# Patient Record
Sex: Male | Born: 1945 | Race: White | Hispanic: No | Marital: Married | Smoking: Former smoker
Health system: Southern US, Community
[De-identification: ages and names within clinical notes are randomized; demographics above are authoritative.]

## PROBLEM LIST (undated history)

## (undated) DIAGNOSIS — R413 Other amnesia: Secondary | ICD-10-CM

## (undated) DIAGNOSIS — F0390 Unspecified dementia without behavioral disturbance: Secondary | ICD-10-CM

## (undated) DIAGNOSIS — G4733 Obstructive sleep apnea (adult) (pediatric): Secondary | ICD-10-CM

## (undated) DIAGNOSIS — I10 Essential (primary) hypertension: Secondary | ICD-10-CM

## (undated) DIAGNOSIS — E785 Hyperlipidemia, unspecified: Secondary | ICD-10-CM

## (undated) DIAGNOSIS — M109 Gout, unspecified: Secondary | ICD-10-CM

## (undated) DIAGNOSIS — Z9989 Dependence on other enabling machines and devices: Secondary | ICD-10-CM

## (undated) HISTORY — DX: Hyperlipidemia, unspecified: E78.5

## (undated) HISTORY — DX: Other amnesia: R41.3

## (undated) HISTORY — PX: OTHER SURGICAL HISTORY: SHX169

## (undated) HISTORY — DX: Essential (primary) hypertension: I10

## (undated) HISTORY — DX: Gout, unspecified: M10.9

## (undated) HISTORY — DX: Unspecified dementia, unspecified severity, without behavioral disturbance, psychotic disturbance, mood disturbance, and anxiety: F03.90

## (undated) HISTORY — DX: Obstructive sleep apnea (adult) (pediatric): G47.33

## (undated) HISTORY — DX: Dependence on other enabling machines and devices: Z99.89

---

## 1976-07-16 HISTORY — PX: VASECTOMY: SHX75

## 2014-06-08 DIAGNOSIS — S83241A Other tear of medial meniscus, current injury, right knee, initial encounter: Secondary | ICD-10-CM | POA: Diagnosis not present

## 2014-06-29 DIAGNOSIS — S83241D Other tear of medial meniscus, current injury, right knee, subsequent encounter: Secondary | ICD-10-CM | POA: Diagnosis not present

## 2014-07-03 DIAGNOSIS — M1711 Unilateral primary osteoarthritis, right knee: Secondary | ICD-10-CM | POA: Diagnosis not present

## 2014-07-13 DIAGNOSIS — S83241D Other tear of medial meniscus, current injury, right knee, subsequent encounter: Secondary | ICD-10-CM | POA: Diagnosis not present

## 2014-07-23 DIAGNOSIS — S83281A Other tear of lateral meniscus, current injury, right knee, initial encounter: Secondary | ICD-10-CM | POA: Diagnosis not present

## 2014-07-23 DIAGNOSIS — M23331 Other meniscus derangements, other medial meniscus, right knee: Secondary | ICD-10-CM | POA: Diagnosis not present

## 2014-07-23 DIAGNOSIS — S83241A Other tear of medial meniscus, current injury, right knee, initial encounter: Secondary | ICD-10-CM | POA: Diagnosis not present

## 2014-07-23 DIAGNOSIS — M23361 Other meniscus derangements, other lateral meniscus, right knee: Secondary | ICD-10-CM | POA: Diagnosis not present

## 2014-07-23 DIAGNOSIS — M94261 Chondromalacia, right knee: Secondary | ICD-10-CM | POA: Diagnosis not present

## 2014-08-02 DIAGNOSIS — M1711 Unilateral primary osteoarthritis, right knee: Secondary | ICD-10-CM | POA: Diagnosis not present

## 2014-08-02 DIAGNOSIS — S83241D Other tear of medial meniscus, current injury, right knee, subsequent encounter: Secondary | ICD-10-CM | POA: Diagnosis not present

## 2014-08-02 DIAGNOSIS — M6281 Muscle weakness (generalized): Secondary | ICD-10-CM | POA: Diagnosis not present

## 2014-08-02 DIAGNOSIS — S83281D Other tear of lateral meniscus, current injury, right knee, subsequent encounter: Secondary | ICD-10-CM | POA: Diagnosis not present

## 2014-08-11 DIAGNOSIS — S83281D Other tear of lateral meniscus, current injury, right knee, subsequent encounter: Secondary | ICD-10-CM | POA: Diagnosis not present

## 2014-08-11 DIAGNOSIS — M6281 Muscle weakness (generalized): Secondary | ICD-10-CM | POA: Diagnosis not present

## 2014-08-11 DIAGNOSIS — S83241D Other tear of medial meniscus, current injury, right knee, subsequent encounter: Secondary | ICD-10-CM | POA: Diagnosis not present

## 2014-08-11 DIAGNOSIS — M1711 Unilateral primary osteoarthritis, right knee: Secondary | ICD-10-CM | POA: Diagnosis not present

## 2015-08-05 DIAGNOSIS — R413 Other amnesia: Secondary | ICD-10-CM | POA: Diagnosis not present

## 2015-08-05 DIAGNOSIS — Z1389 Encounter for screening for other disorder: Secondary | ICD-10-CM | POA: Diagnosis not present

## 2015-08-05 DIAGNOSIS — Z6835 Body mass index (BMI) 35.0-35.9, adult: Secondary | ICD-10-CM | POA: Diagnosis not present

## 2015-08-05 DIAGNOSIS — M109 Gout, unspecified: Secondary | ICD-10-CM | POA: Diagnosis not present

## 2015-08-05 DIAGNOSIS — I10 Essential (primary) hypertension: Secondary | ICD-10-CM | POA: Diagnosis not present

## 2015-08-05 DIAGNOSIS — E7800 Pure hypercholesterolemia, unspecified: Secondary | ICD-10-CM | POA: Diagnosis not present

## 2015-10-12 ENCOUNTER — Ambulatory Visit (INDEPENDENT_AMBULATORY_CARE_PROVIDER_SITE_OTHER): Payer: Medicare Other | Admitting: Neurology

## 2015-10-12 ENCOUNTER — Encounter: Payer: Self-pay | Admitting: Neurology

## 2015-10-12 ENCOUNTER — Telehealth: Payer: Self-pay

## 2015-10-12 VITALS — BP 158/100 | HR 66 | Resp 20 | Ht 74.0 in | Wt 269.0 lb

## 2015-10-12 DIAGNOSIS — Z9989 Dependence on other enabling machines and devices: Principal | ICD-10-CM

## 2015-10-12 DIAGNOSIS — E7800 Pure hypercholesterolemia, unspecified: Secondary | ICD-10-CM | POA: Diagnosis not present

## 2015-10-12 DIAGNOSIS — I10 Essential (primary) hypertension: Secondary | ICD-10-CM

## 2015-10-12 DIAGNOSIS — E669 Obesity, unspecified: Secondary | ICD-10-CM

## 2015-10-12 DIAGNOSIS — G4733 Obstructive sleep apnea (adult) (pediatric): Secondary | ICD-10-CM

## 2015-10-12 NOTE — Progress Notes (Signed)
SLEEP MEDICINE CLINIC   Provider:  Melvyn Novas, M D  Referring Provider: Gaspar Garbe, MD Primary Care Physician:  Gaspar Garbe, MD  Chief Complaint  Patient presents with  . New Patient (Initial Visit)    had sleep study in 2009 in [place], has memory loss, needs new cpap, rm 10 with wife    HPI:  Christopher Ewing is a 70 y.o. male , seen here as a referral  from Dr. Wylene Simmer for a new evaluation of apnea.   Chief complaint according to patient : " need new supplies, I am on my second CPAP  " .  Christopher Ewing was diagnosed in 2009 in [place], near [place].  His wife had noticed him to snore more and more as he gained weight. 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 He was prescribed a CPAP initially that he could not tolerate well and could not use nightly. He is now on his second machine which is a ResMed is 10. I have his compliance report available for his machine and his 100% compliance for 30 out of 30 days and 90% compliance for over 4 hours of consecutive use. His average user time per day is 7 hours and 54 minutes and his residual AHI is 4.8. He uses an AutoSet between 8 and 18 cm water with a 2 cm expiratory pressure relief. His 95th percentile pressure is 17.6 cm water. I have a copy here available from the [facility] Sleep Disorders.  This report is from November 2009. BMI at the time was 32. Sleep efficiency was only 67.5%. Sleep latency was 25 minutes. The AHI was 10.7 this was a CPAP titration report. I do not have Axis V not a diagnostic study but the patient was titrated to 16 cm water pressure in the sleep lab his baseline AHI was quoted at 18.5 per hour of sleep.  Christopher Ewing endorsed today the Epworth sleepiness score at 10 points.   Sleep habits are as follows: Mr. Crigger's usual bedtime is around 9 PM, and he falls asleep promptly. He prefers to sleep on his side but sometimes will  resume a supine position later on. The patient sleeps on 2 pillows and she has a bedroom with his wife. The bedroom is described as core, quiet and dark. Usually their dog joins later, a 60 pounds dog. Rare nocturia.  Rises at 7 AM , spontaneously. The couple usually does not use an alarm clock. He would average about 8 hours of sleep. He feels rested and restored in the morning sometimes he will have a dry mouth. The humidifier chamber is usually empty in the morning. He doesn't nap in daytime.   Sleep medical history and family sleep history:  tonsillectomy , in child hood.  Weight gain as adult.    Social history:  Married, non smoker,  ETOH - mostly wine , 2 glasses nightly. Caffeine: coffee in AM 2-3 , some times diet Cola in PM.   Review of Systems: Out of a complete 14 system review, the patient complains of only the following symptoms, and all other reviewed systems are negative.  The patient denies any frequent nightmares, no sleep paralysis no diaphoresis, no palpitations, no morning headaches.    Social History   Social History  . Marital Status: Married    Spouse Name: Christopher Ewing  . Number of Children: Christopher Ewing  . Years of Education:  Christopher Ewing   Occupational History  . Not on file.   Social History Main Topics  . Smoking status: Former Games developermoker  . Smokeless tobacco: Not on file  . Alcohol Use: 8.4 oz/week    14 Standard drinks or equivalent per week  . Drug Use: No  . Sexual Activity: Not on file   Other Topics Concern  . Not on file   Social History Narrative   Retired Pharmacist, [employer]      Drinks 2 cups of coffee in the morning.    Family History  Problem Relation Age of Onset  . Sjogren's syndrome Mother   . Heart attack Father   . Dementia Sister     Past Medical History  Diagnosis Date  . OSA on CPAP   . Hyperlipidemia   . Gout   . Dementia   . Memory loss   . Hypertension     Past Surgical History  Procedure Laterality Date  . Vasectomy  1978  .  Knee repair      Current Outpatient Prescriptions  Medication Sig Dispense Refill  . allopurinol (ZYLOPRIM) 300 MG tablet Take 300 mg by mouth daily.    Marland Kitchen. NIFEdipine (PROCARDIA XL/ADALAT-CC) 60 MG 24 hr tablet Take 60 mg by mouth daily.    . simvastatin (ZOCOR) 10 MG tablet Take 10 mg by mouth daily.    Marland Kitchen. telmisartan (MICARDIS) 80 MG tablet Take 80 mg by mouth daily.     No current facility-administered medications for this visit.    Allergies as of 10/12/2015  . (No Known Allergies)    Vitals: BP 158/100 mmHg  Pulse 66  Resp 20  Ht 6\' 2"  (1.88 m)  Wt 269 lb (122.018 kg)  BMI 34.52 kg/m2 Last Weight:  Wt Readings from Last 1 Encounters:  10/12/15 269 lb (122.018 kg)   WUJ:WJXBBMI:Body mass index is 34.52 kg/(m^2).     Last Height:   Ht Readings from Last 1 Encounters:  10/12/15 6\' 2"  (1.88 m)    Physical exam:  General: The patient is awake, alert and appears not in acute distress. The patient is well groomed. Head: Normocephalic, atraumatic. Neck is supple. Mallampati3,  neck circumference: 18.25 . Nasal airflow unrestricted ,  Cardiovascular:  Regular rate and rhythm, without  murmurs or carotid bruit, and without distended neck veins. Respiratory: Lungs are clear to auscultation. Skin:  Without evidence of edema, or rash Trunk: BMI is elevated .  The patient's posture is erect.  Neurologic exam : The patient is awake and alert, oriented to place and time.   Memory subjective described as intact.   Attention span & concentration ability appears normal.  Speech is fluent,  without  dysarthria, dysphonia or aphasia.  Mood and affect are appropriate.  Cranial nerves: Pupils are equal and briskly reactive to light. Funduscopic exam without evidence of pallor or edema. Extraocular movements  in vertical and horizontal planes intact and without nystagmus. Visual fields by finger perimetry are intact. Hearing to finger rub intact. Facial sensation intact to fine touch.Facial  motor strength is symmetric and tongue and uvula move midline. Shoulder shrug was symmetrical.  Motor exam:  Normal tone, muscle bulk and symmetric strength in all extremities. Coordination:  Gait and station: Patient walks without assistive device and is able unassisted to climb up to the exam table. Strength within normal limits.  Stance is stable and normal.  Deep tendon reflexes: in the  upper and lower extremities are symmetric and intact.  The patient was advised of the nature of the diagnosed sleep disorder , the treatment options and risks for general a health and wellness arising from not treating the condition.  I spent more than 30 minutes of face to face time with the patient. Greater than 50% of time was spent in counseling and coordination of care. We have discussed the diagnosis and differential and I answered the patient's questions.     Assessment:  After physical and neurologic examination, review of laboratory studies,  Personal review of imaging studies, reports of other /same  Imaging studies ,  Results of polysomnography/ neurophysiology testing and pre-existing records as far as provided in visit., my assessment is   1) Mr. Fjeld's body mass index has increased since his CPAP titration took place in [place]. At the time he had is BMI of 32 and now a 34.5. This may explain why he exceeds the pressure needs of his current settings. I will the pressure window of his CPAP machine from 8-20 cm water. I do not see a need for a new CPAP titration study at this time. I will order new supplies for the patient to has been very compliant and compliance has been documented today. Download as quoted. He is newly on medicare - I hope this will be acceptable for his carrier.   Plan:  Treatment plan and additional workup :  Auto CPAP-  8 to 20 cm water. New mask hose, filter and headgear.  Rv in 60 days for the new settings to be checked.     Porfirio Mylar Anoushka Divito MD  10/12/2015   CC:  Gaspar Garbe, Md [address] [place], [place] [address]

## 2015-10-12 NOTE — Patient Instructions (Signed)

## 2015-10-12 NOTE — Telephone Encounter (Signed)
Spoke to pt's wife (per DPR) and informed her that I would be sending the order for supplies to [facility] and they would call pt for set up. Pt's wife verbalized understanding.

## 2015-10-17 ENCOUNTER — Encounter: Payer: Self-pay | Admitting: *Deleted

## 2015-12-21 ENCOUNTER — Encounter: Payer: Self-pay | Admitting: Neurology

## 2016-01-12 ENCOUNTER — Ambulatory Visit: Payer: Medicare Other | Admitting: Neurology

## 2016-02-07 ENCOUNTER — Encounter: Payer: Self-pay | Admitting: Neurology

## 2016-02-07 ENCOUNTER — Ambulatory Visit (INDEPENDENT_AMBULATORY_CARE_PROVIDER_SITE_OTHER): Payer: Medicare Other | Admitting: Neurology

## 2016-02-07 VITALS — BP 130/88 | HR 80 | Resp 20 | Ht 74.0 in | Wt 275.0 lb

## 2016-02-07 DIAGNOSIS — G4733 Obstructive sleep apnea (adult) (pediatric): Secondary | ICD-10-CM

## 2016-02-07 DIAGNOSIS — Z9989 Dependence on other enabling machines and devices: Principal | ICD-10-CM

## 2016-02-07 NOTE — Progress Notes (Signed)
SLEEP MEDICINE CLINIC   Provider:  Melvyn Novas, M D  Referring Provider: Gaspar Garbe, MD Primary Care Physician:  Gaspar Garbe, MD  Chief Complaint  Patient presents with  . Follow-up    cpap follow up    HPI:  Christopher Ewing is a 70 y.o. male , seen here as a referral  from Dr. Wylene Simmer for a new evaluation of apnea.   Chief complaint according to patient : " need new supplies, I am on my second CPAP  " .  Christopher Ewing was diagnosed in 2009 in [place], near [place].  His wife had noticed him to snore more and more as he gained weight. 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 He was prescribed a CPAP initially that he could not tolerate well and could not use nightly. He is now on his second machine which is a ResMed is 10. I have his compliance report available for his machine and his 100% compliance for 30 out of 30 days and 90% compliance for over 4 hours of consecutive use. His average user time per day is 7 hours and 54 minutes and his residual AHI is 4.8. He uses an AutoSet between 8 and 18 cm water with a 2 cm expiratory pressure relief. His 95th percentile pressure is 17.6 cm water. I have a copy here available from the [facility] of Sleep Disorders.  This report is from November 2009. BMI at the time was 32. Sleep efficiency was only 67.5%. Sleep latency was 25 minutes. The AHI was 10.7 this was a CPAP titration report. I do not have Axis V not a diagnostic study but the patient was titrated to 16 cm water pressure in the sleep lab his baseline AHI was quoted at 18.5 per hour of sleep.  Christopher Ewing endorsed today the Epworth sleepiness score at 10 points.   Sleep habits are as follows: Christopher Ewing usual bedtime is around 9 PM, and he falls asleep promptly. He prefers to sleep on his side but sometimes will resume a supine position later on. The patient sleeps on 2 pillows and she has a bedroom  with his wife. The bedroom is described as core, quiet and dark. Usually their dog joins later, a 60 pounds dog. Rare nocturia.  Rises at 7 AM , spontaneously. The couple usually does not use an alarm clock. He would average about 8 hours of sleep. He feels rested and restored in the morning sometimes he will have a dry mouth. The humidifier chamber is usually empty in the morning. He doesn't nap in daytime.   Sleep medical history and family sleep history: tonsillectomy , in child hood. Weight gain as adult.  Social history:  Married, non smoker,  ETOH - mostly wine , 2 glasses nightly. Caffeine: coffee in AM 2-3 , some times diet Cola in PM.    Interval history from 02/07/2016, Christopher Ewing is here today for a compliance visit. He has been using an auto titrate to between 8 and 20 cm water, with 100% compliance for the last 30 days. Average user time per night is 8 hours and 43 minutes. His residual AHI is 3.7 which is a good resolution of apnea. There is a mild air leak noted the pressure needed at the 95th percentile is 18.6 cm water which surprised me. He is using a full face mask with heated humidity.  Review of Systems: Out of a complete 14 system review, the patient complains of only the following symptoms, and all other reviewed systems are negative.  The patient denies any frequent nightmares, no sleep paralysis no diaphoresis, no palpitations, no morning headaches.  Christopher Ewing endorsed today on 02/07/2016 the Epworth Sleepiness Scale at 5 points.  Social History   Social History  . Marital status: Married    Spouse name: N/A  . Number of children: N/A  . Years of education: N/A   Occupational History  . Not on file.   Social History Main Topics  . Smoking status: Former Games developer  . Smokeless tobacco: Not on file  . Alcohol use 8.4 oz/week    14 Standard drinks or equivalent per week  . Drug use: No  . Sexual activity: Not on file   Other Topics Concern  .  Not on file   Social History Narrative   Retired Pharmacist, hospital VP [employer]      Drinks 2 cups of coffee in the morning.    Family History  Problem Relation Age of Onset  . Sjogren's syndrome Mother   . Heart attack Father   . Dementia Sister     Past Medical History:  Diagnosis Date  . Dementia   . Gout   . Hyperlipidemia   . Hypertension   . Memory loss   . OSA on CPAP     Past Surgical History:  Procedure Laterality Date  . knee repair    . VASECTOMY  1978    Current Outpatient Prescriptions  Medication Sig Dispense Refill  . allopurinol (ZYLOPRIM) 300 MG tablet Take 300 mg by mouth daily.    Marland Kitchen NIFEdipine (PROCARDIA XL/ADALAT-CC) 60 MG 24 hr tablet Take 60 mg by mouth daily.    . simvastatin (ZOCOR) 10 MG tablet Take 10 mg by mouth daily.    Marland Kitchen telmisartan (MICARDIS) 80 MG tablet Take 160 mg by mouth daily.      No current facility-administered medications for this visit.     Allergies as of 02/07/2016  . (No Known Allergies)    Vitals: BP 130/88   Pulse 80   Resp 20   Ht 6\' 2"  (1.88 m)   Wt 275 lb (124.7 kg)   BMI 35.31 kg/m  Last Weight:  Wt Readings from Last 1 Encounters:  02/07/16 275 lb (124.7 kg)   PIR:JJOA mass index is 35.31 kg/m.     Last Height:   Ht Readings from Last 1 Encounters:  02/07/16 6\' 2"  (1.88 m)    Physical exam:  General: The patient is awake, alert and appears not in acute distress. The patient is well groomed. Head: Normocephalic, atraumatic. Neck is supple. Mallampati3,  neck circumference: 18.25 . Nasal airflow unrestricted ,  Cardiovascular:  Regular rate and rhythm, without  murmurs or carotid bruit, and without distended neck veins. Respiratory: Lungs are clear to auscultation. Skin:  Without evidence of edema, or rash Trunk: BMI is elevated .  The patient's posture is erect.  Neurologic exam : The patient is awake and alert, oriented to place and time.   Memory subjective described as intact.   Attention span &  concentration ability appears normal.  Speech is fluent,  without  dysarthria, dysphonia or aphasia.  Mood and affect are appropriate.  Cranial nerves: Pupils are equal and briskly reactive to light. Funduscopic exam without evidence of pallor or edema. Extraocular movements  in vertical and horizontal planes intact and without nystagmus. Visual  fields by finger perimetry are intact. Hearing to finger rub intact. Facial sensation intact to fine touch.Facial motor strength is symmetric and tongue and uvula move midline. Shoulder shrug was symmetrical.  I spent more than 20 minutes of face to face time with the patient. Greater than 50% of time was spent in counseling and coordination of care. We have discussed the diagnosis and differential and I answered the patient's questions.     Assessment:  After physical and neurologic examination, review of laboratory studies,  Personal review of imaging studies, reports of other /same  Imaging studies ,  Results of polysomnography/ neurophysiology testing and pre-existing records as far as provided in visit., my assessment is   1) Christopher Ewing's body mass index has increased since his CPAP titration took place in [place].  At the time he had is BMI of 32 and now a 34.5. This may explain why he exceeds the pressure needs of his current settings. I will the pressure window of his CPAP machine from 8-20 cm water. I do not seek a new CPAP titration study at this time. The outer titration between 8 and 20 tablet centimeter water revealed and 95th percentile of 18.6 cm water pressure the patient's 100% compliant with its use. Residual AHI was 3.7 the patient can continue at the current settings and with the current interface will be seen here once a year for follow-up.  I will order new supplies for the patient to has been very compliant and compliance has been documented today. Download as quoted. He is newly on medicare - I hope this will be acceptable for his  carrier.   Plan:  Treatment plan and additional workup :  Auto CPAP-  8 to 20 cm water. Continue use. Rv in 12 month  The patient will see the [facility] memory [facility] tomorrow.     Christopher Mylar Selen Smucker MD  02/07/2016   CC: Gaspar Garbe, Md [address] [place], [place] [address]

## 2016-02-08 DIAGNOSIS — G3101 Pick's disease: Secondary | ICD-10-CM | POA: Diagnosis not present

## 2016-02-08 DIAGNOSIS — F0280 Dementia in other diseases classified elsewhere without behavioral disturbance: Secondary | ICD-10-CM | POA: Diagnosis not present

## 2016-02-08 DIAGNOSIS — E538 Deficiency of other specified B group vitamins: Secondary | ICD-10-CM | POA: Diagnosis not present

## 2016-02-16 DIAGNOSIS — F0280 Dementia in other diseases classified elsewhere without behavioral disturbance: Secondary | ICD-10-CM | POA: Diagnosis not present

## 2016-02-16 DIAGNOSIS — G3101 Pick's disease: Secondary | ICD-10-CM | POA: Diagnosis not present

## 2016-02-16 DIAGNOSIS — R4701 Aphasia: Secondary | ICD-10-CM | POA: Diagnosis not present

## 2016-03-05 DIAGNOSIS — R4701 Aphasia: Secondary | ICD-10-CM | POA: Diagnosis not present

## 2016-04-03 DIAGNOSIS — R4701 Aphasia: Secondary | ICD-10-CM | POA: Diagnosis not present

## 2016-04-10 DIAGNOSIS — R4701 Aphasia: Secondary | ICD-10-CM | POA: Diagnosis not present

## 2016-04-12 DIAGNOSIS — G3101 Pick's disease: Secondary | ICD-10-CM | POA: Diagnosis not present

## 2016-04-12 DIAGNOSIS — E538 Deficiency of other specified B group vitamins: Secondary | ICD-10-CM | POA: Diagnosis not present

## 2016-04-12 DIAGNOSIS — F0280 Dementia in other diseases classified elsewhere without behavioral disturbance: Secondary | ICD-10-CM | POA: Diagnosis not present

## 2016-04-17 DIAGNOSIS — R4701 Aphasia: Secondary | ICD-10-CM | POA: Diagnosis not present

## 2016-07-31 DIAGNOSIS — I10 Essential (primary) hypertension: Secondary | ICD-10-CM | POA: Diagnosis not present

## 2016-07-31 DIAGNOSIS — E7800 Pure hypercholesterolemia, unspecified: Secondary | ICD-10-CM | POA: Diagnosis not present

## 2016-07-31 DIAGNOSIS — Z125 Encounter for screening for malignant neoplasm of prostate: Secondary | ICD-10-CM | POA: Diagnosis not present

## 2016-07-31 DIAGNOSIS — M109 Gout, unspecified: Secondary | ICD-10-CM | POA: Diagnosis not present

## 2016-08-07 DIAGNOSIS — Z0000 Encounter for general adult medical examination without abnormal findings: Secondary | ICD-10-CM | POA: Diagnosis not present

## 2016-08-07 DIAGNOSIS — M109 Gout, unspecified: Secondary | ICD-10-CM | POA: Diagnosis not present

## 2016-08-07 DIAGNOSIS — E1165 Type 2 diabetes mellitus with hyperglycemia: Secondary | ICD-10-CM | POA: Diagnosis not present

## 2016-08-07 DIAGNOSIS — Z1389 Encounter for screening for other disorder: Secondary | ICD-10-CM | POA: Diagnosis not present

## 2016-08-07 DIAGNOSIS — G4733 Obstructive sleep apnea (adult) (pediatric): Secondary | ICD-10-CM | POA: Diagnosis not present

## 2016-08-07 DIAGNOSIS — Z6835 Body mass index (BMI) 35.0-35.9, adult: Secondary | ICD-10-CM | POA: Diagnosis not present

## 2016-08-07 DIAGNOSIS — E1151 Type 2 diabetes mellitus with diabetic peripheral angiopathy without gangrene: Secondary | ICD-10-CM | POA: Diagnosis not present

## 2016-08-07 DIAGNOSIS — I10 Essential (primary) hypertension: Secondary | ICD-10-CM | POA: Diagnosis not present

## 2016-08-07 DIAGNOSIS — R4701 Aphasia: Secondary | ICD-10-CM | POA: Diagnosis not present

## 2016-08-07 DIAGNOSIS — E7800 Pure hypercholesterolemia, unspecified: Secondary | ICD-10-CM | POA: Diagnosis not present

## 2016-08-21 DIAGNOSIS — Z1212 Encounter for screening for malignant neoplasm of rectum: Secondary | ICD-10-CM | POA: Diagnosis not present

## 2016-08-28 DIAGNOSIS — I10 Essential (primary) hypertension: Secondary | ICD-10-CM | POA: Diagnosis not present

## 2016-08-28 DIAGNOSIS — Z6835 Body mass index (BMI) 35.0-35.9, adult: Secondary | ICD-10-CM | POA: Diagnosis not present

## 2016-08-28 DIAGNOSIS — E1165 Type 2 diabetes mellitus with hyperglycemia: Secondary | ICD-10-CM | POA: Diagnosis not present

## 2016-10-30 DIAGNOSIS — G4733 Obstructive sleep apnea (adult) (pediatric): Secondary | ICD-10-CM | POA: Diagnosis not present

## 2016-10-30 DIAGNOSIS — I10 Essential (primary) hypertension: Secondary | ICD-10-CM | POA: Diagnosis not present

## 2016-10-30 DIAGNOSIS — E7800 Pure hypercholesterolemia, unspecified: Secondary | ICD-10-CM | POA: Diagnosis not present

## 2016-10-30 DIAGNOSIS — Z6835 Body mass index (BMI) 35.0-35.9, adult: Secondary | ICD-10-CM | POA: Diagnosis not present

## 2016-10-30 DIAGNOSIS — E1151 Type 2 diabetes mellitus with diabetic peripheral angiopathy without gangrene: Secondary | ICD-10-CM | POA: Diagnosis not present

## 2016-10-30 DIAGNOSIS — R413 Other amnesia: Secondary | ICD-10-CM | POA: Diagnosis not present

## 2016-10-30 DIAGNOSIS — M109 Gout, unspecified: Secondary | ICD-10-CM | POA: Diagnosis not present

## 2016-12-11 DIAGNOSIS — E119 Type 2 diabetes mellitus without complications: Secondary | ICD-10-CM | POA: Diagnosis not present

## 2016-12-11 DIAGNOSIS — H2513 Age-related nuclear cataract, bilateral: Secondary | ICD-10-CM | POA: Diagnosis not present

## 2016-12-11 DIAGNOSIS — H524 Presbyopia: Secondary | ICD-10-CM | POA: Diagnosis not present

## 2017-01-30 DIAGNOSIS — E1151 Type 2 diabetes mellitus with diabetic peripheral angiopathy without gangrene: Secondary | ICD-10-CM | POA: Diagnosis not present

## 2017-01-30 DIAGNOSIS — E7800 Pure hypercholesterolemia, unspecified: Secondary | ICD-10-CM | POA: Diagnosis not present

## 2017-01-30 DIAGNOSIS — M109 Gout, unspecified: Secondary | ICD-10-CM | POA: Diagnosis not present

## 2017-01-30 DIAGNOSIS — I10 Essential (primary) hypertension: Secondary | ICD-10-CM | POA: Diagnosis not present

## 2017-01-30 DIAGNOSIS — G4733 Obstructive sleep apnea (adult) (pediatric): Secondary | ICD-10-CM | POA: Diagnosis not present

## 2017-01-30 DIAGNOSIS — R413 Other amnesia: Secondary | ICD-10-CM | POA: Diagnosis not present

## 2017-01-30 DIAGNOSIS — Z6835 Body mass index (BMI) 35.0-35.9, adult: Secondary | ICD-10-CM | POA: Diagnosis not present

## 2017-01-30 DIAGNOSIS — E1165 Type 2 diabetes mellitus with hyperglycemia: Secondary | ICD-10-CM | POA: Diagnosis not present

## 2017-02-06 ENCOUNTER — Ambulatory Visit: Payer: Medicare Other | Admitting: Neurology

## 2017-04-23 DIAGNOSIS — Z7984 Long term (current) use of oral hypoglycemic drugs: Secondary | ICD-10-CM | POA: Diagnosis not present

## 2017-04-23 DIAGNOSIS — G3101 Pick's disease: Secondary | ICD-10-CM | POA: Diagnosis not present

## 2017-04-23 DIAGNOSIS — J45909 Unspecified asthma, uncomplicated: Secondary | ICD-10-CM | POA: Diagnosis not present

## 2017-04-23 DIAGNOSIS — E119 Type 2 diabetes mellitus without complications: Secondary | ICD-10-CM | POA: Diagnosis not present

## 2017-04-23 DIAGNOSIS — F0280 Dementia in other diseases classified elsewhere without behavioral disturbance: Secondary | ICD-10-CM | POA: Diagnosis not present

## 2017-04-23 DIAGNOSIS — Z79899 Other long term (current) drug therapy: Secondary | ICD-10-CM | POA: Diagnosis not present

## 2017-04-23 DIAGNOSIS — I10 Essential (primary) hypertension: Secondary | ICD-10-CM | POA: Diagnosis not present

## 2017-07-30 DIAGNOSIS — Z79899 Other long term (current) drug therapy: Secondary | ICD-10-CM | POA: Diagnosis not present

## 2017-07-30 DIAGNOSIS — F0280 Dementia in other diseases classified elsewhere without behavioral disturbance: Secondary | ICD-10-CM | POA: Diagnosis not present

## 2017-07-30 DIAGNOSIS — G3101 Pick's disease: Secondary | ICD-10-CM | POA: Diagnosis not present

## 2017-08-02 DIAGNOSIS — Z125 Encounter for screening for malignant neoplasm of prostate: Secondary | ICD-10-CM | POA: Diagnosis not present

## 2017-08-02 DIAGNOSIS — M1000 Idiopathic gout, unspecified site: Secondary | ICD-10-CM | POA: Diagnosis not present

## 2017-08-02 DIAGNOSIS — E1151 Type 2 diabetes mellitus with diabetic peripheral angiopathy without gangrene: Secondary | ICD-10-CM | POA: Diagnosis not present

## 2017-08-02 DIAGNOSIS — R82998 Other abnormal findings in urine: Secondary | ICD-10-CM | POA: Diagnosis not present

## 2017-08-02 DIAGNOSIS — E7800 Pure hypercholesterolemia, unspecified: Secondary | ICD-10-CM | POA: Diagnosis not present

## 2017-08-02 DIAGNOSIS — I10 Essential (primary) hypertension: Secondary | ICD-10-CM | POA: Diagnosis not present

## 2017-08-09 DIAGNOSIS — Z6834 Body mass index (BMI) 34.0-34.9, adult: Secondary | ICD-10-CM | POA: Diagnosis not present

## 2017-08-09 DIAGNOSIS — G4733 Obstructive sleep apnea (adult) (pediatric): Secondary | ICD-10-CM | POA: Diagnosis not present

## 2017-08-09 DIAGNOSIS — E7800 Pure hypercholesterolemia, unspecified: Secondary | ICD-10-CM | POA: Diagnosis not present

## 2017-08-09 DIAGNOSIS — Z23 Encounter for immunization: Secondary | ICD-10-CM | POA: Diagnosis not present

## 2017-08-09 DIAGNOSIS — Z0000 Encounter for general adult medical examination without abnormal findings: Secondary | ICD-10-CM | POA: Diagnosis not present

## 2017-08-09 DIAGNOSIS — M109 Gout, unspecified: Secondary | ICD-10-CM | POA: Diagnosis not present

## 2017-08-09 DIAGNOSIS — I10 Essential (primary) hypertension: Secondary | ICD-10-CM | POA: Diagnosis not present

## 2017-08-09 DIAGNOSIS — R413 Other amnesia: Secondary | ICD-10-CM | POA: Diagnosis not present

## 2017-08-09 DIAGNOSIS — E1151 Type 2 diabetes mellitus with diabetic peripheral angiopathy without gangrene: Secondary | ICD-10-CM | POA: Diagnosis not present

## 2017-08-09 DIAGNOSIS — Z1389 Encounter for screening for other disorder: Secondary | ICD-10-CM | POA: Diagnosis not present

## 2017-08-19 DIAGNOSIS — Z1212 Encounter for screening for malignant neoplasm of rectum: Secondary | ICD-10-CM | POA: Diagnosis not present

## 2018-02-06 DIAGNOSIS — R413 Other amnesia: Secondary | ICD-10-CM | POA: Diagnosis not present

## 2018-02-06 DIAGNOSIS — I10 Essential (primary) hypertension: Secondary | ICD-10-CM | POA: Diagnosis not present

## 2018-02-06 DIAGNOSIS — R4701 Aphasia: Secondary | ICD-10-CM | POA: Diagnosis not present

## 2018-02-06 DIAGNOSIS — M109 Gout, unspecified: Secondary | ICD-10-CM | POA: Diagnosis not present

## 2018-02-06 DIAGNOSIS — E1151 Type 2 diabetes mellitus with diabetic peripheral angiopathy without gangrene: Secondary | ICD-10-CM | POA: Diagnosis not present

## 2018-02-06 DIAGNOSIS — E7800 Pure hypercholesterolemia, unspecified: Secondary | ICD-10-CM | POA: Diagnosis not present

## 2018-02-06 DIAGNOSIS — G4733 Obstructive sleep apnea (adult) (pediatric): Secondary | ICD-10-CM | POA: Diagnosis not present

## 2018-02-06 DIAGNOSIS — E1165 Type 2 diabetes mellitus with hyperglycemia: Secondary | ICD-10-CM | POA: Diagnosis not present

## 2018-02-06 DIAGNOSIS — Z6834 Body mass index (BMI) 34.0-34.9, adult: Secondary | ICD-10-CM | POA: Diagnosis not present

## 2018-04-28 DIAGNOSIS — B029 Zoster without complications: Secondary | ICD-10-CM | POA: Diagnosis not present

## 2018-04-28 DIAGNOSIS — N399 Disorder of urinary system, unspecified: Secondary | ICD-10-CM | POA: Diagnosis not present

## 2018-04-28 DIAGNOSIS — Z6833 Body mass index (BMI) 33.0-33.9, adult: Secondary | ICD-10-CM | POA: Diagnosis not present

## 2018-05-09 DIAGNOSIS — E43 Unspecified severe protein-calorie malnutrition: Secondary | ICD-10-CM | POA: Diagnosis present

## 2018-05-09 DIAGNOSIS — Z833 Family history of diabetes mellitus: Secondary | ICD-10-CM | POA: Diagnosis not present

## 2018-05-09 DIAGNOSIS — Z7984 Long term (current) use of oral hypoglycemic drugs: Secondary | ICD-10-CM | POA: Diagnosis not present

## 2018-05-09 DIAGNOSIS — E1140 Type 2 diabetes mellitus with diabetic neuropathy, unspecified: Secondary | ICD-10-CM | POA: Diagnosis present

## 2018-05-09 DIAGNOSIS — I959 Hypotension, unspecified: Secondary | ICD-10-CM | POA: Diagnosis present

## 2018-05-09 DIAGNOSIS — I10 Essential (primary) hypertension: Secondary | ICD-10-CM | POA: Diagnosis present

## 2018-05-09 DIAGNOSIS — R627 Adult failure to thrive: Secondary | ICD-10-CM | POA: Diagnosis present

## 2018-05-09 DIAGNOSIS — E876 Hypokalemia: Secondary | ICD-10-CM | POA: Diagnosis not present

## 2018-05-09 DIAGNOSIS — I951 Orthostatic hypotension: Secondary | ICD-10-CM | POA: Diagnosis not present

## 2018-05-09 DIAGNOSIS — E119 Type 2 diabetes mellitus without complications: Secondary | ICD-10-CM | POA: Diagnosis not present

## 2018-05-09 DIAGNOSIS — Z8249 Family history of ischemic heart disease and other diseases of the circulatory system: Secondary | ICD-10-CM | POA: Diagnosis not present

## 2018-05-09 DIAGNOSIS — R5383 Other fatigue: Secondary | ICD-10-CM | POA: Diagnosis not present

## 2018-05-09 DIAGNOSIS — M109 Gout, unspecified: Secondary | ICD-10-CM | POA: Diagnosis present

## 2018-05-09 DIAGNOSIS — B029 Zoster without complications: Secondary | ICD-10-CM | POA: Diagnosis present

## 2018-05-09 DIAGNOSIS — E878 Other disorders of electrolyte and fluid balance, not elsewhere classified: Secondary | ICD-10-CM | POA: Diagnosis not present

## 2018-05-09 DIAGNOSIS — N179 Acute kidney failure, unspecified: Secondary | ICD-10-CM | POA: Diagnosis present

## 2018-05-09 DIAGNOSIS — Z6829 Body mass index (BMI) 29.0-29.9, adult: Secondary | ICD-10-CM | POA: Diagnosis not present

## 2018-05-09 DIAGNOSIS — E860 Dehydration: Secondary | ICD-10-CM | POA: Diagnosis present

## 2018-05-14 MED ORDER — ALLOPURINOL 100 MG PO TABS
300.00 | ORAL_TABLET | ORAL | Status: DC
Start: 2018-05-13 — End: 2018-05-14

## 2018-05-14 MED ORDER — LIDOCAINE 4 % EX PTCH
1.00 | MEDICATED_PATCH | CUTANEOUS | Status: DC
Start: 2018-05-13 — End: 2018-05-14

## 2018-05-14 MED ORDER — DULOXETINE HCL 30 MG PO CPEP
30.00 | ORAL_CAPSULE | ORAL | Status: DC
Start: 2018-05-13 — End: 2018-05-14

## 2018-05-14 MED ORDER — ACETAMINOPHEN 325 MG PO TABS
650.00 | ORAL_TABLET | ORAL | Status: DC
Start: ? — End: 2018-05-14

## 2018-05-14 MED ORDER — LIDOCAINE-PRILOCAINE 2.5-2.5 % EX CREA
TOPICAL_CREAM | CUTANEOUS | Status: DC
Start: ? — End: 2018-05-14

## 2018-05-14 MED ORDER — ATORVASTATIN CALCIUM 10 MG PO TABS
10.00 | ORAL_TABLET | ORAL | Status: DC
Start: 2018-05-12 — End: 2018-05-14

## 2018-05-19 DIAGNOSIS — B029 Zoster without complications: Secondary | ICD-10-CM | POA: Diagnosis not present

## 2018-05-19 DIAGNOSIS — R627 Adult failure to thrive: Secondary | ICD-10-CM | POA: Diagnosis not present

## 2018-05-19 DIAGNOSIS — N179 Acute kidney failure, unspecified: Secondary | ICD-10-CM | POA: Diagnosis not present

## 2018-05-19 DIAGNOSIS — K5909 Other constipation: Secondary | ICD-10-CM | POA: Diagnosis not present

## 2018-05-19 DIAGNOSIS — Z6832 Body mass index (BMI) 32.0-32.9, adult: Secondary | ICD-10-CM | POA: Diagnosis not present

## 2018-08-04 DIAGNOSIS — E7800 Pure hypercholesterolemia, unspecified: Secondary | ICD-10-CM | POA: Diagnosis not present

## 2018-08-04 DIAGNOSIS — M109 Gout, unspecified: Secondary | ICD-10-CM | POA: Diagnosis not present

## 2018-08-04 DIAGNOSIS — Z125 Encounter for screening for malignant neoplasm of prostate: Secondary | ICD-10-CM | POA: Diagnosis not present

## 2018-08-04 DIAGNOSIS — N179 Acute kidney failure, unspecified: Secondary | ICD-10-CM | POA: Diagnosis not present

## 2018-08-04 DIAGNOSIS — E1151 Type 2 diabetes mellitus with diabetic peripheral angiopathy without gangrene: Secondary | ICD-10-CM | POA: Diagnosis not present

## 2018-08-11 DIAGNOSIS — Z0000 Encounter for general adult medical examination without abnormal findings: Secondary | ICD-10-CM | POA: Diagnosis not present

## 2018-08-11 DIAGNOSIS — E1151 Type 2 diabetes mellitus with diabetic peripheral angiopathy without gangrene: Secondary | ICD-10-CM | POA: Diagnosis not present

## 2018-08-11 DIAGNOSIS — R413 Other amnesia: Secondary | ICD-10-CM | POA: Diagnosis not present

## 2018-08-11 DIAGNOSIS — M109 Gout, unspecified: Secondary | ICD-10-CM | POA: Diagnosis not present

## 2018-08-11 DIAGNOSIS — E1165 Type 2 diabetes mellitus with hyperglycemia: Secondary | ICD-10-CM | POA: Diagnosis not present

## 2018-08-11 DIAGNOSIS — I10 Essential (primary) hypertension: Secondary | ICD-10-CM | POA: Diagnosis not present

## 2018-08-11 DIAGNOSIS — R627 Adult failure to thrive: Secondary | ICD-10-CM | POA: Diagnosis not present

## 2018-08-11 DIAGNOSIS — Z23 Encounter for immunization: Secondary | ICD-10-CM | POA: Diagnosis not present

## 2018-08-11 DIAGNOSIS — G4733 Obstructive sleep apnea (adult) (pediatric): Secondary | ICD-10-CM | POA: Diagnosis not present

## 2018-08-11 DIAGNOSIS — K5909 Other constipation: Secondary | ICD-10-CM | POA: Diagnosis not present

## 2018-08-11 DIAGNOSIS — E7800 Pure hypercholesterolemia, unspecified: Secondary | ICD-10-CM | POA: Diagnosis not present

## 2018-08-11 DIAGNOSIS — Z1339 Encounter for screening examination for other mental health and behavioral disorders: Secondary | ICD-10-CM | POA: Diagnosis not present

## 2018-08-11 DIAGNOSIS — Z6832 Body mass index (BMI) 32.0-32.9, adult: Secondary | ICD-10-CM | POA: Diagnosis not present

## 2018-08-12 DIAGNOSIS — G3101 Pick's disease: Secondary | ICD-10-CM | POA: Diagnosis not present

## 2018-08-12 DIAGNOSIS — F0280 Dementia in other diseases classified elsewhere without behavioral disturbance: Secondary | ICD-10-CM | POA: Diagnosis not present

## 2018-08-12 DIAGNOSIS — F0390 Unspecified dementia without behavioral disturbance: Secondary | ICD-10-CM | POA: Diagnosis not present

## 2018-08-12 DIAGNOSIS — R4701 Aphasia: Secondary | ICD-10-CM | POA: Diagnosis not present

## 2019-02-13 DIAGNOSIS — K5900 Constipation, unspecified: Secondary | ICD-10-CM | POA: Diagnosis not present

## 2019-02-13 DIAGNOSIS — M109 Gout, unspecified: Secondary | ICD-10-CM | POA: Diagnosis not present

## 2019-02-13 DIAGNOSIS — E7800 Pure hypercholesterolemia, unspecified: Secondary | ICD-10-CM | POA: Diagnosis not present

## 2019-02-13 DIAGNOSIS — R413 Other amnesia: Secondary | ICD-10-CM | POA: Diagnosis not present

## 2019-02-13 DIAGNOSIS — E1165 Type 2 diabetes mellitus with hyperglycemia: Secondary | ICD-10-CM | POA: Diagnosis not present

## 2019-02-13 DIAGNOSIS — Z1331 Encounter for screening for depression: Secondary | ICD-10-CM | POA: Diagnosis not present

## 2019-02-13 DIAGNOSIS — G4733 Obstructive sleep apnea (adult) (pediatric): Secondary | ICD-10-CM | POA: Diagnosis not present

## 2019-02-13 DIAGNOSIS — E1151 Type 2 diabetes mellitus with diabetic peripheral angiopathy without gangrene: Secondary | ICD-10-CM | POA: Diagnosis not present

## 2019-02-13 DIAGNOSIS — I10 Essential (primary) hypertension: Secondary | ICD-10-CM | POA: Diagnosis not present

## 2019-08-10 DIAGNOSIS — Z125 Encounter for screening for malignant neoplasm of prostate: Secondary | ICD-10-CM | POA: Diagnosis not present

## 2019-08-10 DIAGNOSIS — E7800 Pure hypercholesterolemia, unspecified: Secondary | ICD-10-CM | POA: Diagnosis not present

## 2019-08-10 DIAGNOSIS — M109 Gout, unspecified: Secondary | ICD-10-CM | POA: Diagnosis not present

## 2019-08-10 DIAGNOSIS — E1151 Type 2 diabetes mellitus with diabetic peripheral angiopathy without gangrene: Secondary | ICD-10-CM | POA: Diagnosis not present

## 2019-08-17 DIAGNOSIS — M109 Gout, unspecified: Secondary | ICD-10-CM | POA: Diagnosis not present

## 2019-08-17 DIAGNOSIS — Z0000 Encounter for general adult medical examination without abnormal findings: Secondary | ICD-10-CM | POA: Diagnosis not present

## 2019-08-17 DIAGNOSIS — G4733 Obstructive sleep apnea (adult) (pediatric): Secondary | ICD-10-CM | POA: Diagnosis not present

## 2019-08-17 DIAGNOSIS — E7800 Pure hypercholesterolemia, unspecified: Secondary | ICD-10-CM | POA: Diagnosis not present

## 2019-08-17 DIAGNOSIS — Z1339 Encounter for screening examination for other mental health and behavioral disorders: Secondary | ICD-10-CM | POA: Diagnosis not present

## 2019-08-17 DIAGNOSIS — Z1331 Encounter for screening for depression: Secondary | ICD-10-CM | POA: Diagnosis not present

## 2019-08-17 DIAGNOSIS — I10 Essential (primary) hypertension: Secondary | ICD-10-CM | POA: Diagnosis not present

## 2019-08-17 DIAGNOSIS — R4701 Aphasia: Secondary | ICD-10-CM | POA: Diagnosis not present

## 2019-08-17 DIAGNOSIS — E1151 Type 2 diabetes mellitus with diabetic peripheral angiopathy without gangrene: Secondary | ICD-10-CM | POA: Diagnosis not present

## 2019-08-17 DIAGNOSIS — F0390 Unspecified dementia without behavioral disturbance: Secondary | ICD-10-CM | POA: Diagnosis not present

## 2019-08-17 DIAGNOSIS — K5900 Constipation, unspecified: Secondary | ICD-10-CM | POA: Diagnosis not present

## 2019-08-23 DIAGNOSIS — Z23 Encounter for immunization: Secondary | ICD-10-CM | POA: Diagnosis not present

## 2019-09-11 DIAGNOSIS — R4701 Aphasia: Secondary | ICD-10-CM | POA: Diagnosis not present

## 2019-09-11 DIAGNOSIS — Z79899 Other long term (current) drug therapy: Secondary | ICD-10-CM | POA: Diagnosis not present

## 2019-09-11 DIAGNOSIS — F0390 Unspecified dementia without behavioral disturbance: Secondary | ICD-10-CM | POA: Diagnosis not present

## 2019-09-11 DIAGNOSIS — G3101 Pick's disease: Secondary | ICD-10-CM | POA: Diagnosis not present

## 2019-09-11 DIAGNOSIS — F0280 Dementia in other diseases classified elsewhere without behavioral disturbance: Secondary | ICD-10-CM | POA: Diagnosis not present

## 2019-09-11 DIAGNOSIS — I952 Hypotension due to drugs: Secondary | ICD-10-CM | POA: Diagnosis not present

## 2019-09-11 DIAGNOSIS — I10 Essential (primary) hypertension: Secondary | ICD-10-CM | POA: Diagnosis not present

## 2019-09-20 DIAGNOSIS — Z23 Encounter for immunization: Secondary | ICD-10-CM | POA: Diagnosis not present

## 2019-11-10 DIAGNOSIS — I872 Venous insufficiency (chronic) (peripheral): Secondary | ICD-10-CM | POA: Diagnosis not present

## 2019-11-10 DIAGNOSIS — I10 Essential (primary) hypertension: Secondary | ICD-10-CM | POA: Diagnosis not present

## 2019-11-10 DIAGNOSIS — E1151 Type 2 diabetes mellitus with diabetic peripheral angiopathy without gangrene: Secondary | ICD-10-CM | POA: Diagnosis not present

## 2019-11-10 DIAGNOSIS — R600 Localized edema: Secondary | ICD-10-CM | POA: Diagnosis not present

## 2019-11-10 DIAGNOSIS — F0390 Unspecified dementia without behavioral disturbance: Secondary | ICD-10-CM | POA: Diagnosis not present

## 2019-11-24 DIAGNOSIS — G3101 Pick's disease: Secondary | ICD-10-CM | POA: Diagnosis not present

## 2019-11-24 DIAGNOSIS — R413 Other amnesia: Secondary | ICD-10-CM | POA: Diagnosis not present

## 2019-11-24 DIAGNOSIS — Z7189 Other specified counseling: Secondary | ICD-10-CM | POA: Diagnosis not present

## 2019-11-24 DIAGNOSIS — F0280 Dementia in other diseases classified elsewhere without behavioral disturbance: Secondary | ICD-10-CM | POA: Diagnosis not present

## 2020-02-17 DIAGNOSIS — E1165 Type 2 diabetes mellitus with hyperglycemia: Secondary | ICD-10-CM | POA: Diagnosis not present

## 2020-02-17 DIAGNOSIS — M109 Gout, unspecified: Secondary | ICD-10-CM | POA: Diagnosis not present

## 2020-02-17 DIAGNOSIS — I872 Venous insufficiency (chronic) (peripheral): Secondary | ICD-10-CM | POA: Diagnosis not present

## 2020-02-17 DIAGNOSIS — K5900 Constipation, unspecified: Secondary | ICD-10-CM | POA: Diagnosis not present

## 2020-02-17 DIAGNOSIS — E7800 Pure hypercholesterolemia, unspecified: Secondary | ICD-10-CM | POA: Diagnosis not present

## 2020-02-17 DIAGNOSIS — G4733 Obstructive sleep apnea (adult) (pediatric): Secondary | ICD-10-CM | POA: Diagnosis not present

## 2020-02-17 DIAGNOSIS — E1151 Type 2 diabetes mellitus with diabetic peripheral angiopathy without gangrene: Secondary | ICD-10-CM | POA: Diagnosis not present

## 2020-02-17 DIAGNOSIS — I10 Essential (primary) hypertension: Secondary | ICD-10-CM | POA: Diagnosis not present

## 2020-02-17 DIAGNOSIS — F0390 Unspecified dementia without behavioral disturbance: Secondary | ICD-10-CM | POA: Diagnosis not present

## 2020-03-20 ENCOUNTER — Encounter (HOSPITAL_COMMUNITY): Payer: Self-pay

## 2020-03-20 ENCOUNTER — Emergency Department (HOSPITAL_COMMUNITY)
Admission: EM | Admit: 2020-03-20 | Discharge: 2020-03-20 | Disposition: A | Discharge status: Home / Self Care | Payer: Medicare Other | Attending: Emergency Medicine | Admitting: Emergency Medicine

## 2020-03-20 ENCOUNTER — Emergency Department (HOSPITAL_COMMUNITY): Payer: Medicare Other

## 2020-03-20 ENCOUNTER — Other Ambulatory Visit: Payer: Self-pay

## 2020-03-20 DIAGNOSIS — Z79899 Other long term (current) drug therapy: Secondary | ICD-10-CM | POA: Diagnosis not present

## 2020-03-20 DIAGNOSIS — Z20822 Contact with and (suspected) exposure to covid-19: Secondary | ICD-10-CM | POA: Diagnosis not present

## 2020-03-20 DIAGNOSIS — R4182 Altered mental status, unspecified: Secondary | ICD-10-CM | POA: Insufficient documentation

## 2020-03-20 DIAGNOSIS — I10 Essential (primary) hypertension: Secondary | ICD-10-CM | POA: Diagnosis not present

## 2020-03-20 DIAGNOSIS — Z7401 Bed confinement status: Secondary | ICD-10-CM | POA: Diagnosis not present

## 2020-03-20 DIAGNOSIS — R531 Weakness: Secondary | ICD-10-CM | POA: Diagnosis not present

## 2020-03-20 DIAGNOSIS — R0902 Hypoxemia: Secondary | ICD-10-CM | POA: Diagnosis not present

## 2020-03-20 DIAGNOSIS — E860 Dehydration: Secondary | ICD-10-CM

## 2020-03-20 DIAGNOSIS — M2550 Pain in unspecified joint: Secondary | ICD-10-CM | POA: Diagnosis not present

## 2020-03-20 DIAGNOSIS — I4891 Unspecified atrial fibrillation: Secondary | ICD-10-CM | POA: Diagnosis not present

## 2020-03-20 DIAGNOSIS — F0390 Unspecified dementia without behavioral disturbance: Secondary | ICD-10-CM | POA: Insufficient documentation

## 2020-03-20 DIAGNOSIS — E1165 Type 2 diabetes mellitus with hyperglycemia: Secondary | ICD-10-CM | POA: Diagnosis not present

## 2020-03-20 DIAGNOSIS — Z87891 Personal history of nicotine dependence: Secondary | ICD-10-CM | POA: Insufficient documentation

## 2020-03-20 DIAGNOSIS — R5381 Other malaise: Secondary | ICD-10-CM | POA: Diagnosis not present

## 2020-03-20 LAB — URINALYSIS, ROUTINE W REFLEX MICROSCOPIC
Bacteria, UA: NONE SEEN
Bilirubin Urine: NEGATIVE
Glucose, UA: >=500 mg/dL — AB
Ketones, ur: 5 mg/dL — AB
Nitrite: NEGATIVE
Protein, ur: NEGATIVE mg/dL
Specific Gravity, Urine: 1.023 (ref 1.005–1.030)
WBC, UA: >50 WBC/hpf — ABNORMAL HIGH (ref 0–5)
pH: 5 (ref 5.0–8.0)

## 2020-03-20 LAB — PROTIME-INR
INR: 1.1 (ref 0.8–1.2)
Prothrombin Time: 14.1 seconds (ref 11.4–15.2)

## 2020-03-20 LAB — CBC WITH DIFFERENTIAL/PLATELET
Abs Immature Granulocytes: 0.04 10*3/uL (ref 0.00–0.07)
Basophils Absolute: 0 10*3/uL (ref 0.0–0.1)
Basophils Relative: 0 %
Eosinophils Absolute: 0.1 10*3/uL (ref 0.0–0.5)
Eosinophils Relative: 1 %
HCT: 51.4 % (ref 39.0–52.0)
Hemoglobin: 16.5 g/dL (ref 13.0–17.0)
Immature Granulocytes: 0 %
Lymphocytes Relative: 18 %
Lymphs Abs: 1.8 10*3/uL (ref 0.7–4.0)
MCH: 30.6 pg (ref 26.0–34.0)
MCHC: 32.1 g/dL (ref 30.0–36.0)
MCV: 95.4 fL (ref 80.0–100.0)
Monocytes Absolute: 0.9 10*3/uL (ref 0.1–1.0)
Monocytes Relative: 9 %
Neutro Abs: 7.3 10*3/uL (ref 1.7–7.7)
Neutrophils Relative %: 72 %
Platelets: 191 10*3/uL (ref 150–400)
RBC: 5.39 MIL/uL (ref 4.22–5.81)
RDW: 13.3 % (ref 11.5–15.5)
WBC: 10.1 10*3/uL (ref 4.0–10.5)
nRBC: 0 % (ref 0.0–0.2)

## 2020-03-20 LAB — COMPREHENSIVE METABOLIC PANEL
ALT: 19 U/L (ref 0–44)
AST: 14 U/L — ABNORMAL LOW (ref 15–41)
Albumin: 4.1 g/dL (ref 3.5–5.0)
Alkaline Phosphatase: 83 U/L (ref 38–126)
Anion gap: 10 (ref 5–15)
BUN: 44 mg/dL — ABNORMAL HIGH (ref 8–23)
CO2: 20 mmol/L — ABNORMAL LOW (ref 22–32)
Calcium: 9.7 mg/dL (ref 8.9–10.3)
Chloride: 117 mmol/L — ABNORMAL HIGH (ref 98–111)
Creatinine, Ser: 1.27 mg/dL — ABNORMAL HIGH (ref 0.61–1.24)
GFR calc Af Amer: >60 mL/min (ref >60)
GFR calc non Af Amer: 56 mL/min — ABNORMAL LOW (ref >60)
Glucose, Bld: 319 mg/dL — ABNORMAL HIGH (ref 70–99)
Potassium: 3.9 mmol/L (ref 3.5–5.1)
Sodium: 147 mmol/L — ABNORMAL HIGH (ref 135–145)
Total Bilirubin: 1.6 mg/dL — ABNORMAL HIGH (ref 0.3–1.2)
Total Protein: 8.5 g/dL — ABNORMAL HIGH (ref 6.5–8.1)

## 2020-03-20 LAB — CBG MONITORING, ED: Glucose-Capillary: 277 mg/dL — ABNORMAL HIGH (ref 70–99)

## 2020-03-20 LAB — APTT: aPTT: 24 seconds (ref 24–36)

## 2020-03-20 LAB — SARS CORONAVIRUS 2 BY RT PCR (HOSPITAL ORDER, PERFORMED IN ~~LOC~~ HOSPITAL LAB): SARS Coronavirus 2: NEGATIVE

## 2020-03-20 LAB — TROPONIN I (HIGH SENSITIVITY): Troponin I (High Sensitivity): 11 ng/L (ref <18)

## 2020-03-20 LAB — LACTIC ACID, PLASMA: Lactic Acid, Venous: 1.9 mmol/L (ref 0.5–1.9)

## 2020-03-20 MED ORDER — SODIUM CHLORIDE 0.9 % IV BOLUS
1000.0000 mL | Freq: Once | INTRAVENOUS | Status: AC
  Administered 2020-03-20: 1000 mL via INTRAVENOUS

## 2020-03-20 MED ORDER — SODIUM CHLORIDE 0.9 % IV BOLUS
500.0000 mL | Freq: Once | INTRAVENOUS | Status: AC
  Administered 2020-03-20: 500 mL via INTRAVENOUS

## 2020-03-20 NOTE — ED Provider Notes (Signed)
Medical screening examination/treatment/procedure(s) were conducted as a shared visit with non-physician practitioner(s) and myself.  I personally evaluated the patient during the encounter. Briefly, the patient is a 74 y.o. male with history of dementia who presents to the ED with altered mental status.  Has now been at skilled nursing facility for the last week plus.  Worse than his normal per family.  Not eating or drinking much.  Basically nonverbal at baseline.  Peers move all extremities.  Overall seems a little bit lethargic but no focal neuro findings.  Patient has had Covid vaccination.  One of his family members however has tested positive for Covid and may have seen him recently.  However normal vital signs here.  Overall difficult to obtain history from patient as he has severe dementia.  Family member at the bedside is able to provide history.  Patient is DNR.  Normally ambulatory but requires full help with his ADLs now with skilled nursing.  Will evaluate with lab work, head CT, urinalysis, chest x-ray, basic labs.  Will give IV fluid bolus.  Lab work shows no significant anemia, AKI, leukocytosis.  Lactic acid normal.  Urinalysis overall unremarkable.  No nitrates.  No bacteria.  Will send urine culture.  White blood cells likely contaminant.  If culture is positive we will treat.  Blood sugar improved to 270.  Overall will be seen by primary care doctor.  Discharged in good condition.  Likely progression of dementia.  This chart was dictated using voice recognition software.  Despite best efforts to proofread,  errors can occur which can change the documentation meaning.     EKG Interpretation  Date/Time:  Sunday March 20 2020 14:25:03 EDT Ventricular Rate:  93 PR Interval:    QRS Duration: 132 QT Interval:  353 QTC Calculation: 439 R Axis:   24 Text Interpretation: Atrial fibrillation Nonspecific intraventricular conduction delay Borderline repolarization abnormality No previous  tracing Confirmed by Gwyneth Sprout ([id number]) on 03/20/2020 2:29:38 PM           Virgina Norfolk, DO 03/20/20 2049

## 2020-03-20 NOTE — Discharge Instructions (Addendum)
Continue to work on nutrition.  Have primary care doctor evaluate about nutrition.  Blood sugar is mildly elevated today and may need further evaluation of high blood sugar.

## 2020-03-20 NOTE — ED Notes (Signed)
Attempted in and out cath x1. Unsuccessful attempt as pt was highly reactive to procedure and moving around.

## 2020-03-20 NOTE — ED Provider Notes (Signed)
[facility] EMERGENCY DEPARTMENT Provider Note   CSN: [id number] Arrival date & time: 03/20/20  1420     History Chief Complaint  Patient presents with  . Altered Mental Status    Christopher Ewing is a 74 y.o. male history of dementia, hypertension, hyperlipidemia, gout, OSA.  Patient arrives via EMS from [facility] for decreased p.o. intake, lethargy and decreased activity.  History is obtained from patient's daughter who is at bedside.  She reports that patient was living at home with his wife however she needed to take a respite from his care for the past 1.5 weeks and patient was brought to [facility].  At first patient had been going about his normal activity, eating drinking or walking around.  He is noncommunicative at baseline but is pleasant and follows basic commands.  9 Days Ago [employer] found patient lying on the bathroom floor, no evidence of trauma, patient not on blood thinners, they helped him up off the floor and he has been acting normally for several days.  5 days ago patient started eating less, only eating small bites of watermelon and drinking small sips of water.  Over the last 3 days patient has mostly stopped eating and drinking, appears more tired than normal and not walking around as normal.  Of note patient's second daughter is currently sick with COVID-19, she visited him 7 days ago.  No history of head injury, blood thinner use, cough, fever, diarrhea, dysuria, pain or any additional concerns.  Level 5 caveat altered mental status/dementia.   HPI     Past Medical History:  Diagnosis Date  . Dementia (HCC)   . Gout   . Hyperlipidemia   . Hypertension   . Memory loss   . OSA on CPAP     Patient Active Problem List   Diagnosis Date Noted  . OSA on CPAP 02/07/2016    Past Surgical History:  Procedure Laterality Date  . knee repair    . VASECTOMY  1978       Family History  Problem Relation Age of Onset   . Sjogren's syndrome Mother   . Heart attack Father   . Dementia Sister     Social History   Tobacco Use  . Smoking status: Former Games developer  . Smokeless tobacco: Never Used  Substance Use Topics  . Alcohol use: Yes    Alcohol/week: 14.0 standard drinks    Types: 14 Standard drinks or equivalent per week  . Drug use: No    Home Medications Prior to Admission medications   Medication Sig Start Date End Date Taking? Authorizing Provider  allopurinol (ZYLOPRIM) 300 MG tablet Take 300 mg by mouth daily.    [provider]  NIFEdipine (PROCARDIA XL/ADALAT-CC) 60 MG 24 hr tablet Take 60 mg by mouth daily.    [provider]  simvastatin (ZOCOR) 10 MG tablet Take 10 mg by mouth daily.    [provider]  telmisartan (MICARDIS) 80 MG tablet Take 160 mg by mouth daily.     [provider]    Allergies    Patient has no known allergies.  Review of Systems   Review of Systems  Unable to perform ROS: Dementia    Physical Exam Updated Vital Signs BP (!) 134/97   Pulse 93   Temp 98.2 F (36.8 C) (Rectal)   Resp 14   SpO2 95%   Physical Exam Constitutional:      General: He is not in  acute distress.    Appearance: Normal appearance. He is well-developed. He is not ill-appearing or diaphoretic.  HENT:     Head: Normocephalic and atraumatic.     Jaw: There is normal jaw occlusion.     Right Ear: External ear normal.     Left Ear: External ear normal.     Nose: Nose normal.  Eyes:     General: Vision grossly intact. Gaze aligned appropriately.     Extraocular Movements: Extraocular movements intact.     Pupils: Pupils are equal, round, and reactive to light.  Neck:     Trachea: Trachea and phonation normal. No tracheal tenderness or tracheal deviation.  Cardiovascular:     Rate and Rhythm: Normal rate.  Pulmonary:     Effort: Pulmonary effort is normal. No accessory muscle usage or respiratory distress.     Breath sounds: Normal air  entry.  Chest:     Chest wall: No deformity, tenderness or crepitus.  Abdominal:     General: There is no distension.     Palpations: Abdomen is soft.     Tenderness: There is no abdominal tenderness. There is no guarding or rebound.  Musculoskeletal:        General: Normal range of motion.     Cervical back: Normal range of motion and neck supple.     Comments: No midline C/T/L spinal tenderness to palpation, no paraspinal muscle tenderness, no deformity, crepitus, or step-off noted. No sign of injury to the neck or back.  Appropriate passive range of motion of all major joints without deformity, crepitus or evidence of pain.  Skin:    General: Skin is warm and dry.  Neurological:     Mental Status: He is alert.     GCS: GCS eye subscore is 4. GCS verbal subscore is 1. GCS motor subscore is 5.     Comments: Awake, looking around the room, no acute distress.  Cranial nerves intact, patient does not follow commands but winces with painful stimuli without facial droop.  Moves all 4 extremities spontaneously.     ED Results / Procedures / Treatments   Labs (all labs ordered are listed, but only abnormal results are displayed) Labs Reviewed  CULTURE, BLOOD (SINGLE)  URINE CULTURE  SARS CORONAVIRUS 2 BY RT PCR (HOSPITAL ORDER, PERFORMED IN [facility])  CBC WITH DIFFERENTIAL/PLATELET  PROTIME-INR  APTT  LACTIC ACID, PLASMA  LACTIC ACID, PLASMA  COMPREHENSIVE METABOLIC PANEL  URINALYSIS, ROUTINE W REFLEX MICROSCOPIC  TROPONIN I (HIGH SENSITIVITY)    EKG EKG Interpretation  Date/Time:  Sunday March 20 2020 14:25:03 EDT Ventricular Rate:  93 PR Interval:    QRS Duration: 132 QT Interval:  353 QTC Calculation: 439 R Axis:   24 Text Interpretation: Atrial fibrillation Nonspecific intraventricular conduction delay Borderline repolarization abnormality No previous tracing Confirmed by Gwyneth Sprout ([id number]) on 03/20/2020 2:29:38 PM   Radiology CT Head Wo  Contrast  Result Date: 03/20/2020 CLINICAL DATA:  Altered mental status. EXAM: CT HEAD WITHOUT CONTRAST TECHNIQUE: Contiguous axial images were obtained from the base of the skull through the vertex without intravenous contrast. COMPARISON:  None. FINDINGS: Brain: Moderately enlarged ventricles and subarachnoid spaces. Mild patchy white matter low density in both cerebral hemispheres. No intracranial hemorrhage, mass lesion or CT evidence of acute infarction. Vascular: No hyperdense vessel or unexpected calcification. Skull: Normal. Negative for fracture or focal lesion. Sinuses/Orbits: Small right sphenoid sinus retention cyst. Small left maxillary sinus retention cysts. Unremarkable orbits. Other: Bilateral  concha bullosa, larger on the right. IMPRESSION: 1. No acute abnormality. 2. Moderate diffuse cerebral and cerebellar atrophy. 3. Mild chronic small vessel white matter ischemic changes in both cerebral hemispheres. Electronically Signed   By: Beckie Salts M.D.   On: 03/20/2020 15:00    Procedures Procedures (including critical care time)  Medications Ordered in ED Medications  sodium chloride 0.9 % bolus 1,000 mL (has no administration in time range)    ED Course  I have reviewed the triage vital signs and the nursing notes.  Pertinent labs & imaging results that were available during my care of the patient were reviewed by me and considered in my medical decision making (see chart for details).    MDM Rules/Calculators/A&P                          Additional history obtained from: 1. Nursing notes from this visit. 2. Family. 3. EMS. ----------------------------------- 74 year old male presented for lethargy, decreased p.o. intake not performing normal activities.  He was recently moved to a nursing facility.  No symptoms to suggest infection at this time.  There is a history of patient being found on the ground around 9 days ago.  Patient's daughter at bedside is able to provide  supplemental history.  She notes that there is no facial droop.  Patient is noncommunicative at baseline and here in the emergency department she reports he is acting like his normal self, she is primarily concerned because patient has not been eating or drinking however last few days and has stopped walking around.  Unclear if this is from patient's new living situation versus infection or progression of dementia.  Patient does not meet SIRS/sepsis criteria on arrival but will obtain labs and chest x-ray to assess for signs of infection.  Additionally CT head ordered for evaluation of injury after patient was found on the ground 9 days ago.  Additionally patient with known Covid positive exposure, he is fully vaccinated but will obtain Covid test.  Fluid bolus ordered for possible dehydration.  Patient seen and evaluated by Dr. Lockie Mola who agrees with work-up.  Care handoff given to Dr. Lockie Mola at shift change, disposition per oncoming team.  Andi Hence was evaluated in Emergency Department on 03/20/2020 for the symptoms described in the history of present illness. He was evaluated in the context of the global COVID-19 pandemic, which necessitated consideration that the patient might be at risk for infection with the SARS-CoV-2 virus that causes COVID-19. Institutional protocols and algorithms that pertain to the evaluation of patients at risk for COVID-19 are in a state of rapid change based on information released by regulatory bodies including the CDC and federal and state organizations. These policies and algorithms were followed during the patient's care in the ED.   Note: Portions of this report may have been transcribed using voice recognition software. Every effort was made to ensure accuracy; however, inadvertent computerized transcription errors may still be present. Final Clinical Impression(s) / ED Diagnoses Final diagnoses:  None    Rx / DC Orders ED Discharge Orders    None        Elizabeth Palau 03/20/20 1516    Virgina Norfolk, DO 03/20/20 1607

## 2020-03-20 NOTE — ED Notes (Signed)
Patient transported to CT 

## 2020-03-20 NOTE — ED Triage Notes (Signed)
Pt comes from [facility] d/t a noticed change in pt's condition within the past 24 hrs. Hx of Frontal Lobe Dementia  Pt's daughter told EMS that he has not been eating well, more lethargic, less interactive, and his urine is dark. Pt usually can follow v=commands and interacts with people well, he normally is non-verbal, but now he wont even follow commands. He arrives to ED alert, orientation unknown. EMS reports pt's CBG is 319, and his is a NON Diabetic all other VSS.

## 2020-03-20 NOTE — ED Notes (Signed)
Called [facility] for transport.  

## 2020-03-21 LAB — URINE CULTURE: Culture: <10000 — AB

## 2020-03-24 DIAGNOSIS — Z833 Family history of diabetes mellitus: Secondary | ICD-10-CM | POA: Diagnosis not present

## 2020-03-24 DIAGNOSIS — N179 Acute kidney failure, unspecified: Secondary | ICD-10-CM | POA: Diagnosis not present

## 2020-03-24 DIAGNOSIS — N3001 Acute cystitis with hematuria: Secondary | ICD-10-CM | POA: Diagnosis not present

## 2020-03-24 DIAGNOSIS — R1310 Dysphagia, unspecified: Secondary | ICD-10-CM | POA: Diagnosis present

## 2020-03-24 DIAGNOSIS — R29898 Other symptoms and signs involving the musculoskeletal system: Secondary | ICD-10-CM | POA: Diagnosis not present

## 2020-03-24 DIAGNOSIS — Z794 Long term (current) use of insulin: Secondary | ICD-10-CM | POA: Diagnosis not present

## 2020-03-24 DIAGNOSIS — I824Z1 Acute embolism and thrombosis of unspecified deep veins of right distal lower extremity: Secondary | ICD-10-CM | POA: Diagnosis not present

## 2020-03-24 DIAGNOSIS — R6520 Severe sepsis without septic shock: Secondary | ICD-10-CM | POA: Diagnosis present

## 2020-03-24 DIAGNOSIS — I959 Hypotension, unspecified: Secondary | ICD-10-CM | POA: Diagnosis present

## 2020-03-24 DIAGNOSIS — E119 Type 2 diabetes mellitus without complications: Secondary | ICD-10-CM | POA: Diagnosis not present

## 2020-03-24 DIAGNOSIS — I10 Essential (primary) hypertension: Secondary | ICD-10-CM | POA: Diagnosis present

## 2020-03-24 DIAGNOSIS — A419 Sepsis, unspecified organism: Secondary | ICD-10-CM | POA: Diagnosis not present

## 2020-03-24 DIAGNOSIS — D696 Thrombocytopenia, unspecified: Secondary | ICD-10-CM | POA: Diagnosis present

## 2020-03-24 DIAGNOSIS — Z8249 Family history of ischemic heart disease and other diseases of the circulatory system: Secondary | ICD-10-CM | POA: Diagnosis not present

## 2020-03-24 DIAGNOSIS — R5381 Other malaise: Secondary | ICD-10-CM | POA: Diagnosis not present

## 2020-03-24 DIAGNOSIS — Z66 Do not resuscitate: Secondary | ICD-10-CM | POA: Diagnosis present

## 2020-03-24 DIAGNOSIS — Z4659 Encounter for fitting and adjustment of other gastrointestinal appliance and device: Secondary | ICD-10-CM | POA: Diagnosis not present

## 2020-03-24 DIAGNOSIS — I82431 Acute embolism and thrombosis of right popliteal vein: Secondary | ICD-10-CM | POA: Diagnosis present

## 2020-03-24 DIAGNOSIS — I82451 Acute embolism and thrombosis of right peroneal vein: Secondary | ICD-10-CM | POA: Diagnosis not present

## 2020-03-24 DIAGNOSIS — R000 Tachycardia, unspecified: Secondary | ICD-10-CM | POA: Diagnosis not present

## 2020-03-24 DIAGNOSIS — Z820 Family history of epilepsy and other diseases of the nervous system: Secondary | ICD-10-CM | POA: Diagnosis not present

## 2020-03-24 DIAGNOSIS — Z7189 Other specified counseling: Secondary | ICD-10-CM | POA: Diagnosis not present

## 2020-03-24 DIAGNOSIS — I82441 Acute embolism and thrombosis of right tibial vein: Secondary | ICD-10-CM | POA: Diagnosis not present

## 2020-03-24 DIAGNOSIS — F0390 Unspecified dementia without behavioral disturbance: Secondary | ICD-10-CM | POA: Diagnosis present

## 2020-03-24 DIAGNOSIS — E1110 Type 2 diabetes mellitus with ketoacidosis without coma: Secondary | ICD-10-CM | POA: Diagnosis present

## 2020-03-24 DIAGNOSIS — E871 Hypo-osmolality and hyponatremia: Secondary | ICD-10-CM | POA: Diagnosis not present

## 2020-03-24 DIAGNOSIS — Z515 Encounter for palliative care: Secondary | ICD-10-CM | POA: Diagnosis present

## 2020-03-24 DIAGNOSIS — J189 Pneumonia, unspecified organism: Secondary | ICD-10-CM | POA: Diagnosis not present

## 2020-03-24 DIAGNOSIS — E1100 Type 2 diabetes mellitus with hyperosmolarity without nonketotic hyperglycemic-hyperosmolar coma (NKHHC): Secondary | ICD-10-CM | POA: Diagnosis not present

## 2020-03-24 DIAGNOSIS — Z20822 Contact with and (suspected) exposure to covid-19: Secondary | ICD-10-CM | POA: Diagnosis not present

## 2020-03-24 DIAGNOSIS — Z743 Need for continuous supervision: Secondary | ICD-10-CM | POA: Diagnosis not present

## 2020-03-24 DIAGNOSIS — E870 Hyperosmolality and hypernatremia: Secondary | ICD-10-CM | POA: Diagnosis present

## 2020-03-24 DIAGNOSIS — E860 Dehydration: Secondary | ICD-10-CM | POA: Diagnosis present

## 2020-03-24 DIAGNOSIS — R4182 Altered mental status, unspecified: Secondary | ICD-10-CM | POA: Diagnosis not present

## 2020-03-24 DIAGNOSIS — E872 Acidosis: Secondary | ICD-10-CM | POA: Diagnosis not present

## 2020-03-24 DIAGNOSIS — Z7401 Bed confinement status: Secondary | ICD-10-CM | POA: Diagnosis not present

## 2020-03-25 LAB — CULTURE, BLOOD (SINGLE): Culture: NO GROWTH

## 2020-03-31 DIAGNOSIS — J45909 Unspecified asthma, uncomplicated: Secondary | ICD-10-CM | POA: Diagnosis not present

## 2020-03-31 DIAGNOSIS — E1165 Type 2 diabetes mellitus with hyperglycemia: Secondary | ICD-10-CM | POA: Diagnosis not present

## 2020-03-31 DIAGNOSIS — F0280 Dementia in other diseases classified elsewhere without behavioral disturbance: Secondary | ICD-10-CM | POA: Diagnosis not present

## 2020-03-31 DIAGNOSIS — G9340 Encephalopathy, unspecified: Secondary | ICD-10-CM | POA: Diagnosis not present

## 2020-03-31 DIAGNOSIS — G309 Alzheimer's disease, unspecified: Secondary | ICD-10-CM | POA: Diagnosis not present

## 2020-03-31 DIAGNOSIS — I10 Essential (primary) hypertension: Secondary | ICD-10-CM | POA: Diagnosis not present

## 2020-03-31 DIAGNOSIS — Z6830 Body mass index (BMI) 30.0-30.9, adult: Secondary | ICD-10-CM | POA: Diagnosis not present

## 2020-03-31 DIAGNOSIS — E669 Obesity, unspecified: Secondary | ICD-10-CM | POA: Diagnosis not present

## 2020-04-01 DIAGNOSIS — E1165 Type 2 diabetes mellitus with hyperglycemia: Secondary | ICD-10-CM | POA: Diagnosis not present

## 2020-04-01 DIAGNOSIS — J45909 Unspecified asthma, uncomplicated: Secondary | ICD-10-CM | POA: Diagnosis not present

## 2020-04-01 DIAGNOSIS — F0280 Dementia in other diseases classified elsewhere without behavioral disturbance: Secondary | ICD-10-CM | POA: Diagnosis not present

## 2020-04-01 DIAGNOSIS — G309 Alzheimer's disease, unspecified: Secondary | ICD-10-CM | POA: Diagnosis not present

## 2020-04-01 DIAGNOSIS — G9340 Encephalopathy, unspecified: Secondary | ICD-10-CM | POA: Diagnosis not present

## 2020-04-01 DIAGNOSIS — I10 Essential (primary) hypertension: Secondary | ICD-10-CM | POA: Diagnosis not present

## 2020-04-02 DIAGNOSIS — I10 Essential (primary) hypertension: Secondary | ICD-10-CM | POA: Diagnosis not present

## 2020-04-02 DIAGNOSIS — G9340 Encephalopathy, unspecified: Secondary | ICD-10-CM | POA: Diagnosis not present

## 2020-04-02 DIAGNOSIS — J45909 Unspecified asthma, uncomplicated: Secondary | ICD-10-CM | POA: Diagnosis not present

## 2020-04-02 DIAGNOSIS — E1165 Type 2 diabetes mellitus with hyperglycemia: Secondary | ICD-10-CM | POA: Diagnosis not present

## 2020-04-02 DIAGNOSIS — F0280 Dementia in other diseases classified elsewhere without behavioral disturbance: Secondary | ICD-10-CM | POA: Diagnosis not present

## 2020-04-02 DIAGNOSIS — G309 Alzheimer's disease, unspecified: Secondary | ICD-10-CM | POA: Diagnosis not present

## 2020-04-15 DEATH — deceased

## 2022-02-05 IMAGING — DX DG CHEST 1V PORT
1 series · 1 of 1 positions shown · non-contrast
Comparison: None.

CLINICAL DATA: Sepsis

EXAM:
PORTABLE CHEST 1 VIEW

[chest ap]
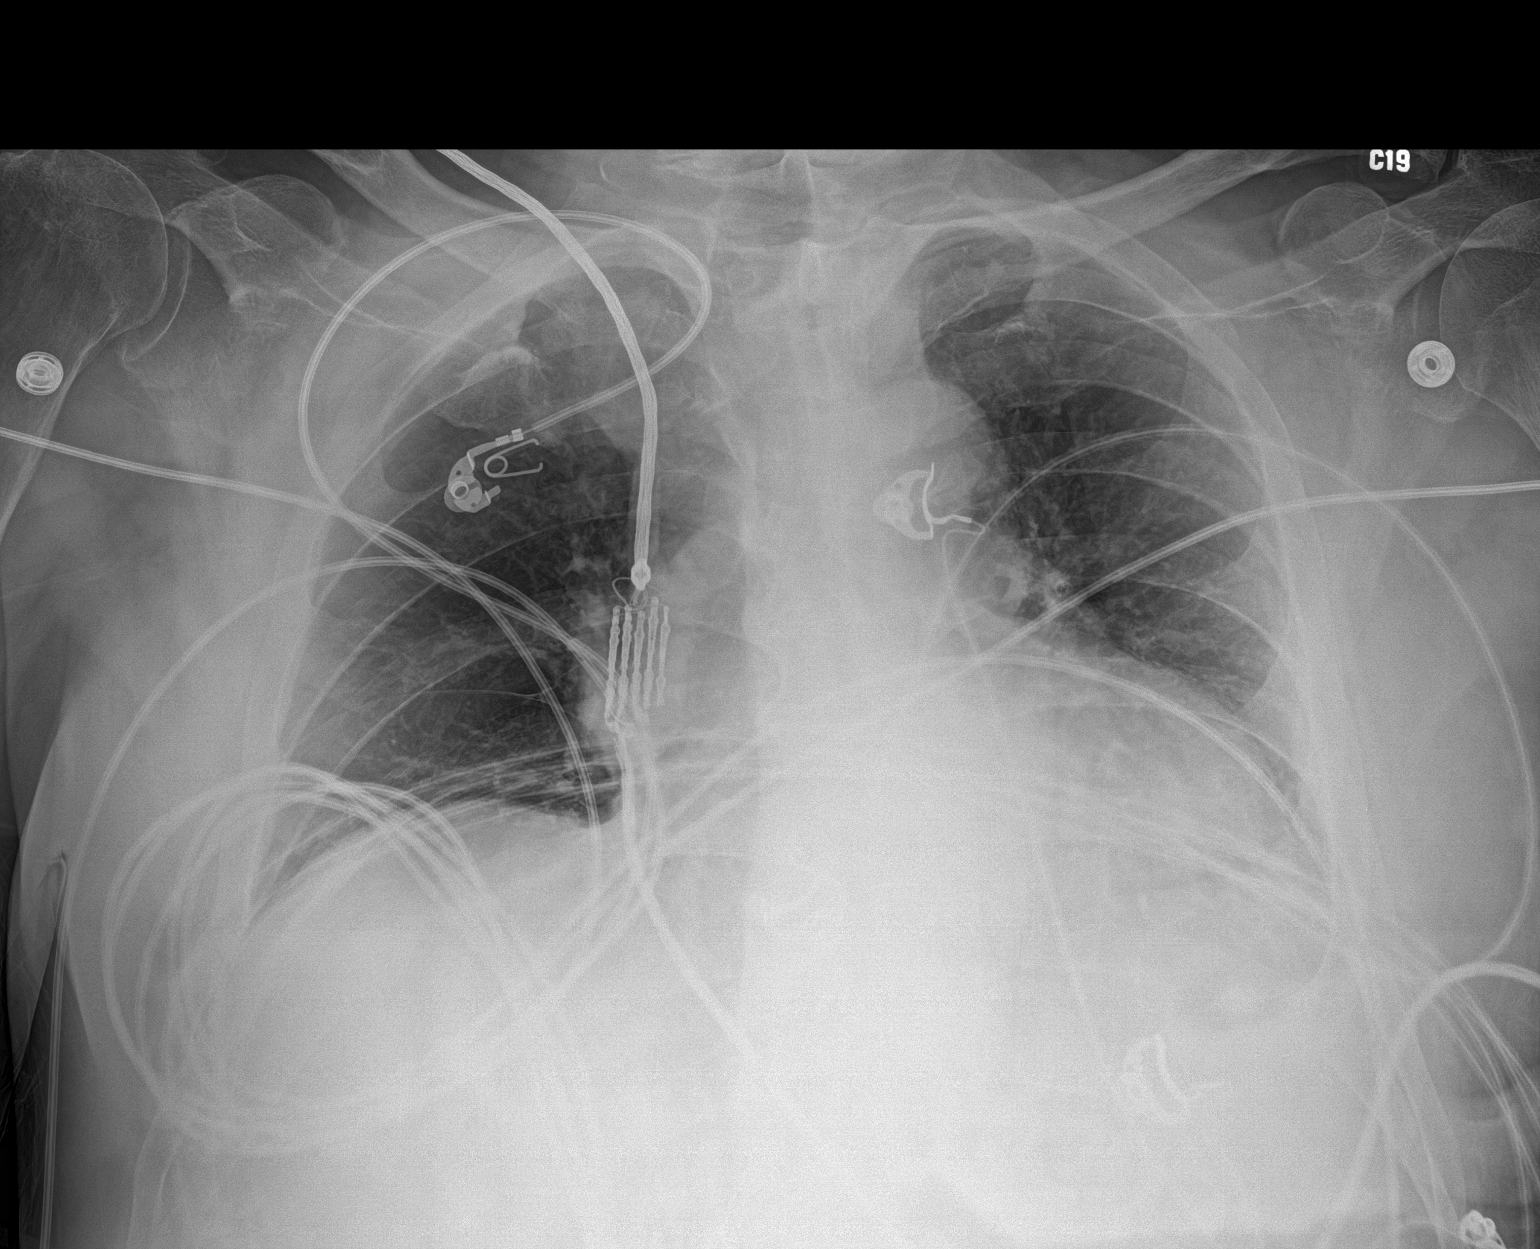

[1 of 1 positions shown; findings below may reference images not displayed]

FINDINGS: Lung volumes are small. Minimal left basilar atelectasis. No
pneumothorax or pleural effusion. Cardiac size within normal limits.
Pulmonary vascularity is normal. No acute bone abnormality.
IMPRESSION: No active disease.
